# Patient Record
Sex: Male | Born: 1955 | Race: White | Hispanic: No | Marital: Married | Smoking: Never smoker
Health system: Southern US, Community
[De-identification: ages and names within clinical notes are randomized; demographics above are authoritative.]

## PROBLEM LIST (undated history)

## (undated) DIAGNOSIS — I2510 Atherosclerotic heart disease of native coronary artery without angina pectoris: Secondary | ICD-10-CM

## (undated) DIAGNOSIS — I10 Essential (primary) hypertension: Secondary | ICD-10-CM

## (undated) HISTORY — PX: CORONARY ANGIOPLASTY WITH STENT PLACEMENT: SHX49

---

## 2018-06-12 ENCOUNTER — Encounter (HOSPITAL_BASED_OUTPATIENT_CLINIC_OR_DEPARTMENT_OTHER): Payer: Self-pay | Admitting: *Deleted

## 2018-06-12 ENCOUNTER — Emergency Department (HOSPITAL_BASED_OUTPATIENT_CLINIC_OR_DEPARTMENT_OTHER): Payer: BLUE CROSS/BLUE SHIELD

## 2018-06-12 ENCOUNTER — Other Ambulatory Visit: Payer: Self-pay

## 2018-06-12 ENCOUNTER — Emergency Department (HOSPITAL_BASED_OUTPATIENT_CLINIC_OR_DEPARTMENT_OTHER)
Admission: EM | Admit: 2018-06-12 | Discharge: 2018-06-12 | Disposition: A | Discharge status: Home / Self Care | Payer: BLUE CROSS/BLUE SHIELD | Attending: Emergency Medicine | Admitting: Emergency Medicine

## 2018-06-12 DIAGNOSIS — W228XXA Striking against or struck by other objects, initial encounter: Secondary | ICD-10-CM | POA: Insufficient documentation

## 2018-06-12 DIAGNOSIS — Y939 Activity, unspecified: Secondary | ICD-10-CM | POA: Insufficient documentation

## 2018-06-12 DIAGNOSIS — S92421A Displaced fracture of distal phalanx of right great toe, initial encounter for closed fracture: Secondary | ICD-10-CM | POA: Diagnosis not present

## 2018-06-12 DIAGNOSIS — I2510 Atherosclerotic heart disease of native coronary artery without angina pectoris: Secondary | ICD-10-CM | POA: Diagnosis not present

## 2018-06-12 DIAGNOSIS — Y999 Unspecified external cause status: Secondary | ICD-10-CM | POA: Diagnosis not present

## 2018-06-12 DIAGNOSIS — S99921A Unspecified injury of right foot, initial encounter: Secondary | ICD-10-CM

## 2018-06-12 DIAGNOSIS — Z7901 Long term (current) use of anticoagulants: Secondary | ICD-10-CM | POA: Diagnosis not present

## 2018-06-12 DIAGNOSIS — Y929 Unspecified place or not applicable: Secondary | ICD-10-CM | POA: Insufficient documentation

## 2018-06-12 DIAGNOSIS — I10 Essential (primary) hypertension: Secondary | ICD-10-CM | POA: Diagnosis not present

## 2018-06-12 HISTORY — DX: Atherosclerotic heart disease of native coronary artery without angina pectoris: I25.10

## 2018-06-12 HISTORY — DX: Essential (primary) hypertension: I10

## 2018-06-12 MED ORDER — CEPHALEXIN 500 MG PO CAPS: 1000.0000 mg | ORAL_CAPSULE | Freq: Two times a day (BID) | ORAL | 0 refills | Status: AC

## 2018-06-12 NOTE — ED Notes (Signed)
Paged Ortho Surgery again @ 19:41 due to no response

## 2018-06-12 NOTE — ED Provider Notes (Signed)
[facility] EMERGENCY DEPARTMENT Provider Note   CSN: [id number] Arrival date & time: 06/12/18  1808    History   Chief Complaint Chief Complaint  Patient presents with  . Foot Injury    HPI Derek Vaughan is a 63 y.o. male.     63 y/o male with a PMH of HTN, cardiac angioplasty with stents placement presents to the ED with a chief complaint of right foot injury x 4 hours ago.  Patient reports being at home and having a acrylic, 45 pound object landed on the tip of his right great toe.  He reports when accident occurred there was blood oozing out significantly with flexion of his toe.  States he is currently on Plavix for his cardiac stents.  Patient reports he wrapped his toe around but states it continued to bleed, he denies any other injuries.  Patient reports his last tetanus shot was 3 to 5 years ago.     Past Medical History:  Diagnosis Date  . Coronary artery disease   . Hypertension     There are no active problems to display for this patient.   Past Surgical History:  Procedure Laterality Date  . CORONARY ANGIOPLASTY WITH STENT PLACEMENT          Home Medications    Prior to Admission medications   Medication Sig Start Date End Date Taking? Authorizing Provider  cephALEXin (KEFLEX) 500 MG capsule Take 2 capsules (1,000 mg total) by mouth 2 (two) times daily for 7 days. 06/12/18 06/19/18  Claude Manges, PA-C    Family History No family history on file.  Social History Social History   Tobacco Use  . Smoking status: Never Smoker  . Smokeless tobacco: Never Used  Substance Use Topics  . Alcohol use: Never    Frequency: Never  . Drug use: Never     Allergies   Patient has no known allergies.   Review of Systems Review of Systems  Constitutional: Negative for chills and fever.  Skin: Positive for wound.     Physical Exam Updated Vital Signs BP (!) 160/77   Pulse 60   Temp 98.4 F (36.9 C) (Oral)   Resp 20   Ht 6' (1.829 m)    Wt 115.2 kg   SpO2 98%   BMI 34.45 kg/m   Physical Exam Vitals signs and nursing note reviewed.  Constitutional:      Appearance: He is well-developed.  HENT:     Head: Normocephalic and atraumatic.  Eyes:     General: No scleral icterus.    Pupils: Pupils are equal, round, and reactive to light.  Neck:     Musculoskeletal: Normal range of motion.  Cardiovascular:     Heart sounds: Normal heart sounds.  Pulmonary:     Effort: Pulmonary effort is normal.     Breath sounds: Normal breath sounds. No wheezing.  Chest:     Chest wall: No tenderness.  Abdominal:     General: Bowel sounds are normal. There is no distension.     Palpations: Abdomen is soft.     Tenderness: There is no abdominal tenderness.  Musculoskeletal:        General: No tenderness or deformity.  Skin:    General: Skin is warm and dry.  Neurological:     Mental Status: He is alert and oriented to person, place, and time.          ED Treatments / Results  Labs (all labs ordered are  listed, but only abnormal results are displayed) Labs Reviewed - No data to display  EKG None  Radiology Dg Toe Great Right  Result Date: 06/12/2018 CLINICAL DATA:  Toe injury EXAM: RIGHT GREAT TOE COMPARISON:  None. FINDINGS: Comminuted fracture of the distal first phalanx involving the tuft and mid phalanx. Mild compression of the dorsal cortex. Fracture may extend to the interphalangeal joint. No significant arthropathy. IMPRESSION: Comminuted fracture distal first phalanx Electronically Signed   By: Marlan Palau M.D.   On: 06/12/2018 18:47    Procedures Procedures (including critical care time)  Medications Ordered in ED Medications - No data to display   Initial Impression / Assessment and Plan / ED Course  I have reviewed the triage vital signs and the nursing notes.  Pertinent labs & imaging results that were available during my care of the patient were reviewed by me and considered in my medical  decision making (see chart for details).       Patient with an extensive medical history such as cardiac stent currently on Plavix presents to the ED after right great toe injury.  He reports dropping a 45 pound object to his foot.  He originally wrapped his toe but noted that the bleeding had worsened with unwrapping and flexion of the great toe. He reports his last tetanus vaccines was within 3-5 years.   Right foot xray showed:  Comminuted fracture distal first phalanx  8:07 PM Spoke to Dr. Dion Saucier who advised patient be irrigated thoroughly, placed on postop shoe, follow-up with him in office tomorrow or Friday.  We will also provide him a prescription for Keflex.  Return precautions provided.   Final Clinical Impressions(s) / ED Diagnoses   Final diagnoses:  Injury of right foot, initial encounter  Displaced fracture of distal phalanx of right great toe, initial encounter for closed fracture    ED Discharge Orders         Ordered    cephALEXin (KEFLEX) 500 MG capsule  2 times daily     06/12/18 2007           Claude Manges, Cordelia Poche 06/12/18 2019    Maia Plan, MD 06/12/18 2104

## 2018-06-12 NOTE — ED Triage Notes (Addendum)
He dropped a piece of acrylic on his right great toe this afternoon.  Every time he flexes his toe it starts to bleed.

## 2018-06-12 NOTE — Discharge Instructions (Signed)
I have prescribed antibiotics, please take 1 pill twice a day for the next 7 days.  I have also provided the number to Dr. Dion Saucier, please call to schedule an appointment to see him tomorrow or Friday.

## 2020-09-05 IMAGING — DX RIGHT GREAT TOE
3 series · 3 of 3 positions shown · non-contrast
Comparison: None.

CLINICAL DATA: Toe injury

EXAM:
RIGHT GREAT TOE

[toe ap]
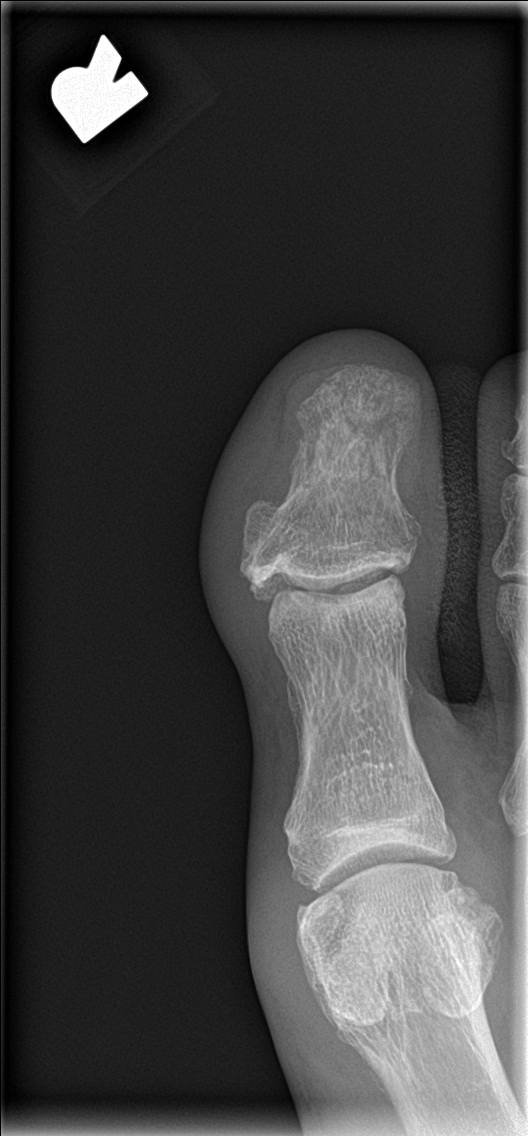

[toe obl]
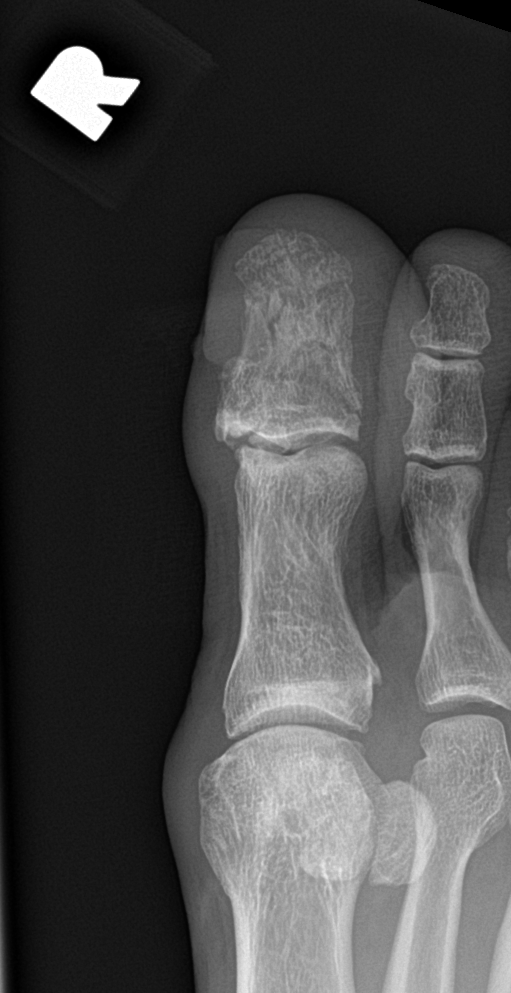

[toe lat]
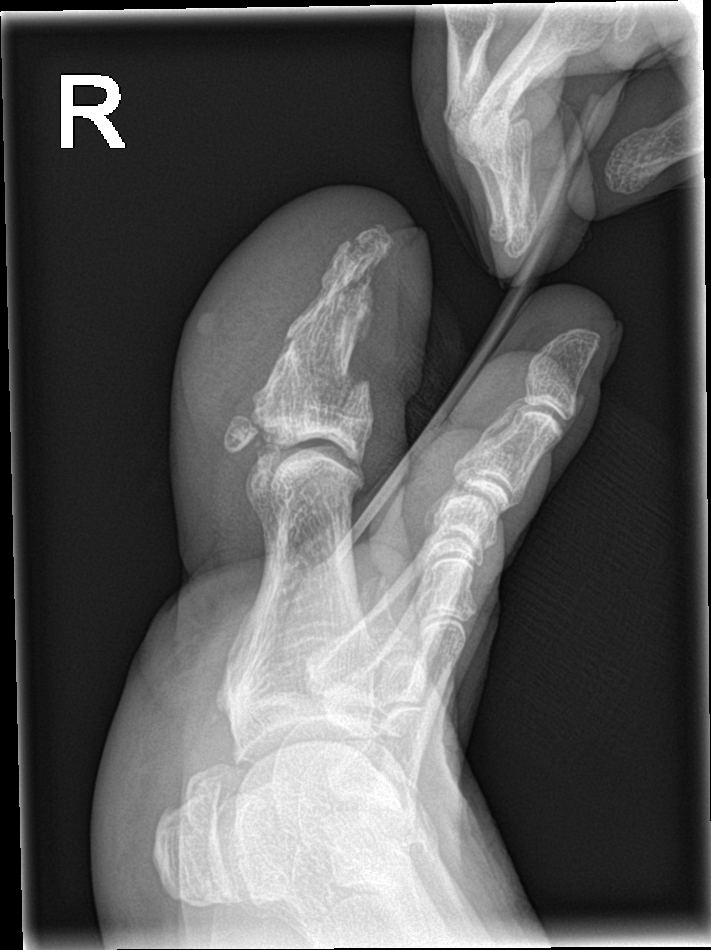

[3 of 3 positions shown; findings below may reference images not displayed]

FINDINGS: Comminuted fracture of the distal first phalanx involving the tuft
and mid phalanx. Mild compression of the dorsal cortex. Fracture may
extend to the interphalangeal joint. No significant arthropathy.
IMPRESSION: Comminuted fracture distal first phalanx
